# Patient Record
Sex: Male | Born: 1980 | Race: White | Hispanic: No | Marital: Single | Smoking: Former smoker
Health system: Southern US, Community
[De-identification: ages and names within clinical notes are randomized; demographics above are authoritative.]

## PROBLEM LIST (undated history)

## (undated) DIAGNOSIS — E7800 Pure hypercholesterolemia, unspecified: Secondary | ICD-10-CM

## (undated) HISTORY — PX: WISDOM TOOTH EXTRACTION: SHX21

## (undated) HISTORY — PX: EYE SURGERY: SHX253

## (undated) HISTORY — PX: TONSILLECTOMY: SUR1361

---

## 2010-02-15 ENCOUNTER — Emergency Department (HOSPITAL_COMMUNITY): Admission: EM | Admit: 2010-02-15 | Discharge: 2009-05-18 | Payer: Self-pay | Admitting: Emergency Medicine

## 2010-06-04 LAB — URINALYSIS, ROUTINE W REFLEX MICROSCOPIC
Glucose, UA: NEGATIVE mg/dL
Hgb urine dipstick: NEGATIVE
Protein, ur: NEGATIVE mg/dL
Specific Gravity, Urine: 1.034 — ABNORMAL HIGH (ref 1.005–1.030)

## 2010-06-04 LAB — POCT I-STAT, CHEM 8
BUN: 19 mg/dL (ref 6–23)
Creatinine, Ser: 0.8 mg/dL (ref 0.4–1.5)
Potassium: 3.5 mEq/L (ref 3.5–5.1)
Sodium: 138 mEq/L (ref 135–145)

## 2013-06-22 ENCOUNTER — Emergency Department (HOSPITAL_COMMUNITY): Payer: 59

## 2013-06-22 ENCOUNTER — Encounter (HOSPITAL_COMMUNITY): Payer: Self-pay | Admitting: Emergency Medicine

## 2013-06-22 ENCOUNTER — Emergency Department (HOSPITAL_COMMUNITY)
Admission: EM | Admit: 2013-06-22 | Discharge: 2013-06-22 | Disposition: A | Discharge status: Home / Self Care | Payer: 59 | Attending: Emergency Medicine | Admitting: Emergency Medicine

## 2013-06-22 DIAGNOSIS — IMO0002 Reserved for concepts with insufficient information to code with codable children: Secondary | ICD-10-CM | POA: Insufficient documentation

## 2013-06-22 DIAGNOSIS — Z8639 Personal history of other endocrine, nutritional and metabolic disease: Secondary | ICD-10-CM | POA: Insufficient documentation

## 2013-06-22 DIAGNOSIS — Y92410 Unspecified street and highway as the place of occurrence of the external cause: Secondary | ICD-10-CM | POA: Insufficient documentation

## 2013-06-22 DIAGNOSIS — T148XXA Other injury of unspecified body region, initial encounter: Secondary | ICD-10-CM

## 2013-06-22 DIAGNOSIS — Z862 Personal history of diseases of the blood and blood-forming organs and certain disorders involving the immune mechanism: Secondary | ICD-10-CM | POA: Insufficient documentation

## 2013-06-22 DIAGNOSIS — Y9389 Activity, other specified: Secondary | ICD-10-CM | POA: Insufficient documentation

## 2013-06-22 DIAGNOSIS — S0990XA Unspecified injury of head, initial encounter: Secondary | ICD-10-CM

## 2013-06-22 DIAGNOSIS — S4980XA Other specified injuries of shoulder and upper arm, unspecified arm, initial encounter: Secondary | ICD-10-CM | POA: Insufficient documentation

## 2013-06-22 DIAGNOSIS — S46909A Unspecified injury of unspecified muscle, fascia and tendon at shoulder and upper arm level, unspecified arm, initial encounter: Secondary | ICD-10-CM | POA: Insufficient documentation

## 2013-06-22 DIAGNOSIS — Z87891 Personal history of nicotine dependence: Secondary | ICD-10-CM | POA: Insufficient documentation

## 2013-06-22 HISTORY — DX: Pure hypercholesterolemia, unspecified: E78.00

## 2013-06-22 MED ORDER — IBUPROFEN 400 MG PO TABS
400.0000 mg | ORAL_TABLET | Freq: Once | ORAL | Status: AC
  Administered 2013-06-22: 400 mg via ORAL
  Filled 2013-06-22: qty 1

## 2013-06-22 MED ORDER — HYDROCODONE-ACETAMINOPHEN 5-325 MG PO TABS: 1.0000 | ORAL_TABLET | ORAL | Status: AC | PRN
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
## 2013-06-22 NOTE — ED Notes (Signed)
To x-ray
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
## 2013-06-24 ENCOUNTER — Other Ambulatory Visit (INDEPENDENT_AMBULATORY_CARE_PROVIDER_SITE_OTHER): Payer: Self-pay | Admitting: Surgery

## 2013-06-24 DIAGNOSIS — M62838 Other muscle spasm: Secondary | ICD-10-CM | POA: Insufficient documentation

## 2013-06-24 MED ORDER — CYCLOBENZAPRINE HCL 5 MG PO TABS: 5.0000 mg | ORAL_TABLET | Freq: Three times a day (TID) | ORAL | Status: AC | PRN
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
